# Patient Record
Sex: Female | Born: 1960 | Race: White | Hispanic: No | Marital: Married | Smoking: Never smoker
Health system: Southern US, Community
[De-identification: ages and names within clinical notes are randomized; demographics above are authoritative.]

## PROBLEM LIST (undated history)

## (undated) DIAGNOSIS — E119 Type 2 diabetes mellitus without complications: Secondary | ICD-10-CM

## (undated) DIAGNOSIS — Z87442 Personal history of urinary calculi: Secondary | ICD-10-CM

## (undated) DIAGNOSIS — T7840XA Allergy, unspecified, initial encounter: Secondary | ICD-10-CM

## (undated) DIAGNOSIS — I10 Essential (primary) hypertension: Secondary | ICD-10-CM

## (undated) HISTORY — DX: Type 2 diabetes mellitus without complications: E11.9

## (undated) HISTORY — DX: Essential (primary) hypertension: I10

## (undated) HISTORY — DX: Allergy, unspecified, initial encounter: T78.40XA

## (undated) HISTORY — PX: CHOLECYSTECTOMY: SHX55

## (undated) HISTORY — DX: Personal history of urinary calculi: Z87.442

## (undated) HISTORY — PX: NASAL SINUS SURGERY: SHX719

## (undated) HISTORY — PX: INNER EAR SURGERY: SHX679

## (undated) HISTORY — PX: TONSILLECTOMY AND ADENOIDECTOMY: SUR1326

---

## 1997-12-02 ENCOUNTER — Emergency Department (HOSPITAL_COMMUNITY): Admission: EM | Admit: 1997-12-02 | Discharge: 1997-12-03 | Payer: Self-pay | Admitting: Emergency Medicine

## 1999-11-23 ENCOUNTER — Other Ambulatory Visit: Admission: RE | Admit: 1999-11-23 | Discharge: 1999-11-23 | Payer: Self-pay | Admitting: Gynecology

## 2003-04-06 ENCOUNTER — Other Ambulatory Visit: Admission: RE | Admit: 2003-04-06 | Discharge: 2003-04-06 | Payer: Self-pay | Admitting: Obstetrics and Gynecology

## 2005-05-21 ENCOUNTER — Other Ambulatory Visit: Admission: RE | Admit: 2005-05-21 | Discharge: 2005-05-21 | Payer: Self-pay | Admitting: Obstetrics and Gynecology

## 2007-02-16 ENCOUNTER — Emergency Department (HOSPITAL_COMMUNITY): Admission: EM | Admit: 2007-02-16 | Discharge: 2007-02-17 | Payer: Self-pay | Admitting: Emergency Medicine

## 2009-05-29 DIAGNOSIS — I10 Essential (primary) hypertension: Secondary | ICD-10-CM | POA: Insufficient documentation

## 2009-06-07 DIAGNOSIS — E119 Type 2 diabetes mellitus without complications: Secondary | ICD-10-CM | POA: Insufficient documentation

## 2010-08-19 ENCOUNTER — Inpatient Hospital Stay (HOSPITAL_COMMUNITY): Admission: AD | Admit: 2010-08-19 | Payer: Self-pay | Source: Ambulatory Visit | Admitting: Obstetrics & Gynecology

## 2011-01-03 DIAGNOSIS — E669 Obesity, unspecified: Secondary | ICD-10-CM | POA: Insufficient documentation

## 2011-03-07 LAB — I-STAT 8, (EC8 V) (CONVERTED LAB)
Acid-Base Excess: 1
Bicarbonate: 27.1 — ABNORMAL HIGH
HCT: 48 — ABNORMAL HIGH
Operator id: 272551
pCO2, Ven: 46.6

## 2011-03-07 LAB — POCT PREGNANCY, URINE
Operator id: 272551
Preg Test, Ur: NEGATIVE

## 2011-03-07 LAB — DIFFERENTIAL
Lymphocytes Relative: 13
Lymphs Abs: 1.6
Neutrophils Relative %: 83 — ABNORMAL HIGH

## 2011-03-07 LAB — CBC
HCT: 43
Platelets: 251
WBC: 12.3 — ABNORMAL HIGH

## 2011-03-07 LAB — URINALYSIS, ROUTINE W REFLEX MICROSCOPIC
Glucose, UA: NEGATIVE
Ketones, ur: NEGATIVE
Protein, ur: NEGATIVE

## 2011-03-07 LAB — URINE MICROSCOPIC-ADD ON

## 2011-03-07 LAB — POCT I-STAT CREATININE: Creatinine, Ser: 0.9

## 2014-09-16 ENCOUNTER — Encounter: Payer: Self-pay | Admitting: Internal Medicine

## 2015-08-25 ENCOUNTER — Ambulatory Visit (AMBULATORY_SURGERY_CENTER): Payer: Self-pay | Admitting: *Deleted

## 2015-08-25 VITALS — Ht 63.5 in | Wt 197.0 lb

## 2015-08-25 DIAGNOSIS — Z1211 Encounter for screening for malignant neoplasm of colon: Secondary | ICD-10-CM

## 2015-08-25 MED ORDER — NA SULFATE-K SULFATE-MG SULF 17.5-3.13-1.6 GM/177ML PO SOLN: 1.0000 | Freq: Once | ORAL | Status: DC

## 2015-08-25 NOTE — Progress Notes (Signed)
No egg or soy allergy known to patient  with past sedation issues with any surgeries  or procedures, no intubation problems --anesthesia makes her have increased N/V --- No diet pills per patient No home 02 use per patient  No blood thinners per patient  Pt denies issues with constipation  emmi video declined

## 2015-09-05 ENCOUNTER — Ambulatory Visit (AMBULATORY_SURGERY_CENTER): Payer: BC Managed Care – PPO | Admitting: Gastroenterology

## 2015-09-05 ENCOUNTER — Encounter: Payer: Self-pay | Admitting: Gastroenterology

## 2015-09-05 VITALS — BP 132/63 | HR 71 | Temp 98.4°F | Resp 17 | Ht 63.0 in | Wt 197.0 lb

## 2015-09-05 DIAGNOSIS — D122 Benign neoplasm of ascending colon: Secondary | ICD-10-CM

## 2015-09-05 DIAGNOSIS — K635 Polyp of colon: Secondary | ICD-10-CM

## 2015-09-05 DIAGNOSIS — D123 Benign neoplasm of transverse colon: Secondary | ICD-10-CM | POA: Diagnosis not present

## 2015-09-05 DIAGNOSIS — D125 Benign neoplasm of sigmoid colon: Secondary | ICD-10-CM

## 2015-09-05 DIAGNOSIS — Z1211 Encounter for screening for malignant neoplasm of colon: Secondary | ICD-10-CM

## 2015-09-05 LAB — GLUCOSE, CAPILLARY
Glucose-Capillary: 154 mg/dL — ABNORMAL HIGH (ref 65–99)
Glucose-Capillary: 158 mg/dL — ABNORMAL HIGH (ref 65–99)

## 2015-09-05 MED ORDER — SODIUM CHLORIDE 0.9 % IV SOLN: 500.0000 mL | INTRAVENOUS | Status: DC

## 2015-09-05 NOTE — Op Note (Signed)
[facility] Patient Name: Marie Mcintyre Procedure Date: 09/05/2015 9:13 AM MRN: [id number] Endoscopist: Mauri Pole , MD Age: 55 Date of Birth: February 13, 1961 Gender: Female Procedure:                Colonoscopy Indications:              Screening for colorectal malignant neoplasm Medicines:                Monitored Anesthesia Care Procedure:                Pre-Anesthesia Assessment:                           - Prior to the procedure, a History and Physical                            was performed, and patient medications and                            allergies were reviewed. The patient's tolerance of                            previous anesthesia was also reviewed. The risks                            and benefits of the procedure and the sedation                            options and risks were discussed with the patient.                            All questions were answered, and informed consent                            was obtained. Prior Anticoagulants: The patient has                            taken no previous anticoagulant or antiplatelet                            agents. ASA Grade Assessment: II - A patient with                            mild systemic disease. After reviewing the risks                            and benefits, the patient was deemed in                            satisfactory condition to undergo the procedure.                           After obtaining informed consent, the colonoscope  was passed under direct vision. Throughout the                            procedure, the patient's blood pressure, pulse, and                            oxygen saturations were monitored continuously. The                            Model [id number]) scope was introduced                            through the anus and advanced to the the terminal                            ileum, with identification of the appendiceal                          orifice and IC valve. The colonoscopy was performed                            without difficulty. The patient tolerated the                            procedure well. The quality of the bowel                            preparation was good. The terminal ileum, ileocecal                            valve, appendiceal orifice, and rectum were                            photographed. Scope In: 9:28:50 AM Scope Out: 9:46:16 AM Scope Withdrawal Time: 0 hours 13 minutes 1 second  Total Procedure Duration: 0 hours 17 minutes 26 seconds  Findings:                 The perianal and digital rectal examinations were                            normal.                           A 12 mm polyp was found in the ascending colon. The                            polyp was sessile. The polyp was removed with a hot                            snare. Resection and retrieval were complete.                           A 8 mm polyp was found in the transverse colon. The  polyp was sessile. The polyp was removed with a                            cold snare. Resection and retrieval were complete.                           A 4 mm polyp was found in the sigmoid colon. The                            polyp was sessile. The polyp was removed with a                            cold biopsy forceps. Resection and retrieval were                            complete.                           A few small-mouthed diverticula were found in the                            sigmoid colon, descending colon and ascending colon. Complications:            No immediate complications. Estimated Blood Loss:     Estimated blood loss was minimal. Impression:               - One 12 mm polyp in the ascending colon, removed                            with a hot snare. Resected and retrieved.                           - One 8 mm polyp in the transverse colon, removed                            with a  cold snare. Resected and retrieved.                           - One 4 mm polyp in the sigmoid colon, removed with                            a cold biopsy forceps. Resected and retrieved.                           - Diverticulosis in the sigmoid colon, in the                            descending colon and in the ascending colon. Recommendation:           - Patient has a contact number available for                            emergencies. The signs and symptoms of potential  delayed complications were discussed with the                            patient. Return to normal activities tomorrow.                            Written discharge instructions were provided to the                            patient.                           - Resume previous diet.                           - Continue present medications.                           - Await pathology results.                           - Repeat colonoscopy in 3 years for surveillance.                           - Return to GI clinic PRN. Mauri Pole, MD 09/05/2015 9:55:19 AM This report has been signed electronically.

## 2015-09-05 NOTE — Progress Notes (Signed)
Patient awakening,vss,report to rn 

## 2015-09-05 NOTE — Progress Notes (Signed)
Called to room to assist during endoscopic procedure.  Patient ID and intended procedure confirmed with present staff. Received instructions for my participation in the procedure from the performing physician.  

## 2015-09-05 NOTE — Patient Instructions (Signed)
YOU HAD AN ENDOSCOPIC PROCEDURE TODAY AT THE [facility]:   Refer to the procedure report that was given to you for any specific questions about what was found during the examination.  If the procedure report does not answer your questions, please call your gastroenterologist to clarify.  If you requested that your care partner not be given the details of your procedure findings, then the procedure report has been included in a sealed envelope for you to review at your convenience later.  YOU SHOULD EXPECT: Some feelings of bloating in the abdomen. Passage of more gas than usual.  Walking can help get rid of the air that was put into your GI tract during the procedure and reduce the bloating. If you had a lower endoscopy (such as a colonoscopy or flexible sigmoidoscopy) you may notice spotting of blood in your stool or on the toilet paper. If you underwent a bowel prep for your procedure, you may not have a normal bowel movement for a few days.  Please Note:  You might notice some irritation and congestion in your nose or some drainage.  This is from the oxygen used during your procedure.  There is no need for concern and it should clear up in a day or so.  SYMPTOMS TO REPORT IMMEDIATELY:   Following lower endoscopy (colonoscopy or flexible sigmoidoscopy):  Excessive amounts of blood in the stool  Significant tenderness or worsening of abdominal pains  Swelling of the abdomen that is new, acute  Fever of 100F or higher    For urgent or emergent issues, a gastroenterologist can be reached at any hour by calling [phone].   DIET: Your first meal following the procedure should be a small meal and then it is ok to progress to your normal diet. Heavy or fried foods are harder to digest and may make you feel nauseous or bloated.  Likewise, meals heavy in dairy and vegetables can increase bloating.  Drink plenty of fluids but you should avoid alcoholic beverages for 24  hours.  ACTIVITY:  You should plan to take it easy for the rest of today and you should NOT DRIVE or use heavy machinery until tomorrow (because of the sedation medicines used during the test).    FOLLOW UP: Our staff will call the number listed on your records the next business day following your procedure to check on you and address any questions or concerns that you may have regarding the information given to you following your procedure. If we do not reach you, we will leave a message.  However, if you are feeling well and you are not experiencing any problems, there is no need to return our call.  We will assume that you have returned to your regular daily activities without incident.  If any biopsies were taken you will be contacted by phone or by letter within the next 1-3 weeks.  Please call us at [phone] if you have not heard about the biopsies in 3 weeks.    SIGNATURES/CONFIDENTIALITY: You and/or your care partner have signed paperwork which will be entered into your electronic medical record.  These signatures attest to the fact that that the information above on your After Visit Summary has been reviewed and is understood.  Full responsibility of the confidentiality of this discharge information lies with you and/or your care-partner.   Resume medications. Information given on polyps,diverticulosis and high fiber diet. 

## 2015-09-06 ENCOUNTER — Telehealth: Payer: Self-pay

## 2015-09-06 NOTE — Telephone Encounter (Signed)
  Follow up Call-  Call back number 09/05/2015  Post procedure Call Back phone  # [phone]  Permission to leave phone message Yes     Patient questions:  Do you have a fever, pain , or abdominal swelling? No. Pain Score  0 *  Have you tolerated food without any problems? Yes.    Have you been able to return to your normal activities? Yes.    Do you have any questions about your discharge instructions: Diet   No. Medications  No. Follow up visit  No.  Do you have questions or concerns about your Care? No.  Actions: * If pain score is 4 or above: No action needed, pain <4.

## 2015-09-13 ENCOUNTER — Encounter: Payer: Self-pay | Admitting: Gastroenterology

## 2018-09-30 DIAGNOSIS — B3731 Acute candidiasis of vulva and vagina: Secondary | ICD-10-CM | POA: Insufficient documentation

## 2018-10-02 ENCOUNTER — Encounter: Payer: Self-pay | Admitting: Gastroenterology

## 2019-04-19 DIAGNOSIS — Z0000 Encounter for general adult medical examination without abnormal findings: Secondary | ICD-10-CM | POA: Insufficient documentation

## 2021-04-27 ENCOUNTER — Other Ambulatory Visit: Payer: Self-pay | Admitting: Obstetrics

## 2021-04-27 DIAGNOSIS — Z1231 Encounter for screening mammogram for malignant neoplasm of breast: Secondary | ICD-10-CM

## 2021-05-30 ENCOUNTER — Ambulatory Visit
Admission: RE | Admit: 2021-05-30 | Discharge: 2021-05-30 | Disposition: A | Discharge status: Home / Self Care | Payer: BC Managed Care – PPO | Source: Ambulatory Visit | Attending: Obstetrics | Admitting: Obstetrics

## 2021-05-30 DIAGNOSIS — Z1231 Encounter for screening mammogram for malignant neoplasm of breast: Secondary | ICD-10-CM

## 2022-01-30 DIAGNOSIS — J45909 Unspecified asthma, uncomplicated: Secondary | ICD-10-CM | POA: Insufficient documentation

## 2022-09-12 IMAGING — MG MM DIGITAL SCREENING BILAT W/ TOMO AND CAD
6 of 10 series · 6 of 30 positions shown · non-contrast
Comparison: Previous exam(s).

CLINICAL DATA: Screening.

EXAM:
DIGITAL SCREENING BILATERAL MAMMOGRAM WITH TOMOSYNTHESIS AND CAD
TECHNIQUE: Bilateral screening digital craniocaudal and mediolateral oblique
mammograms were obtained. Bilateral screening digital breast
tomosynthesis was performed. The images were evaluated with
computer-aided detection.

[L MLO synth-2D]
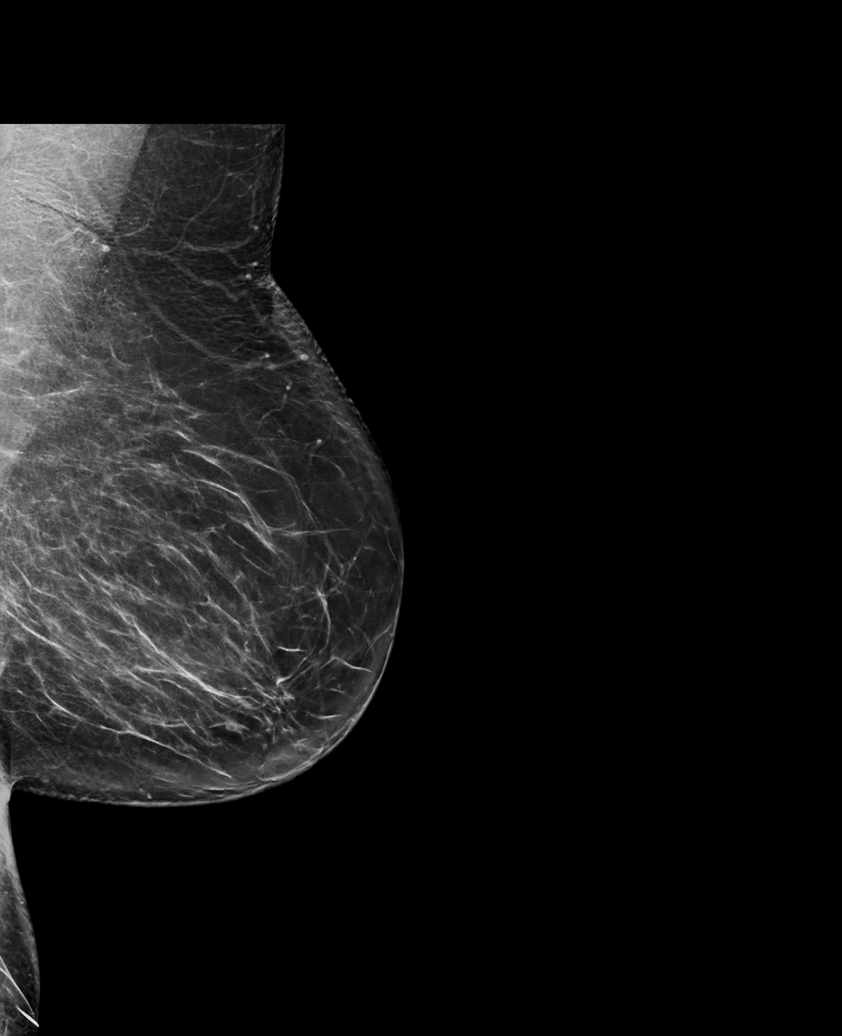

[R MLO synth-2D (1 of 2)]
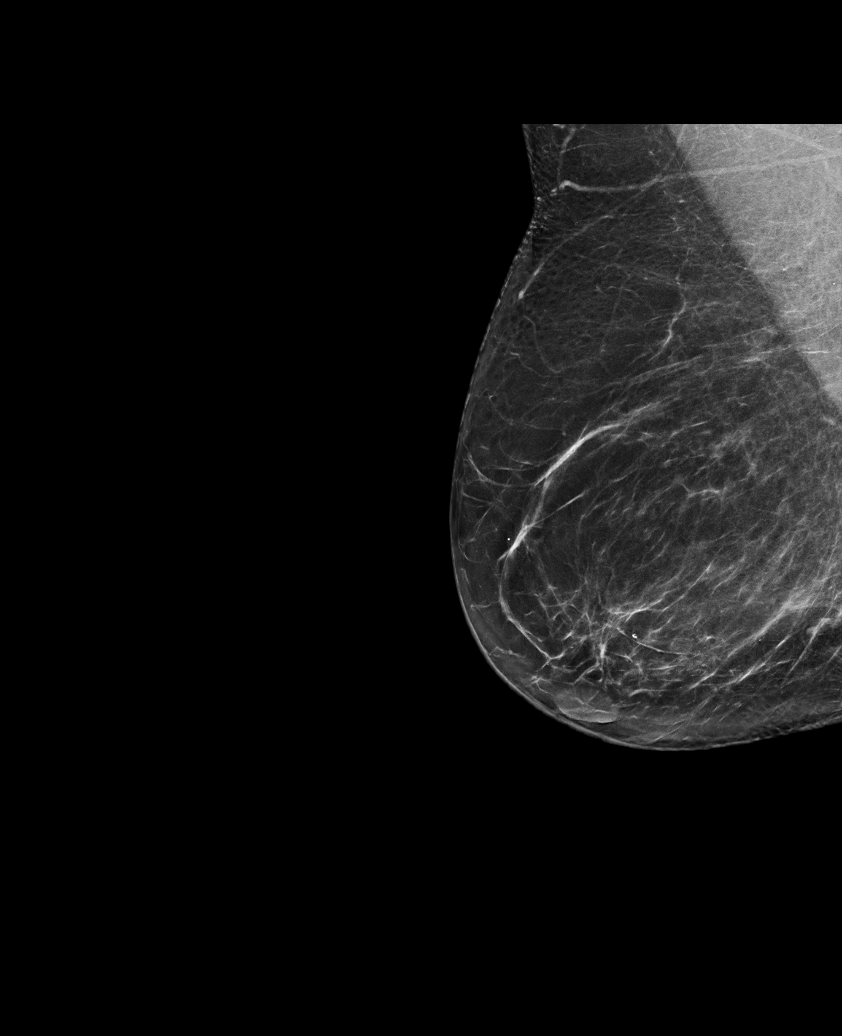

[R CC synth-2D]
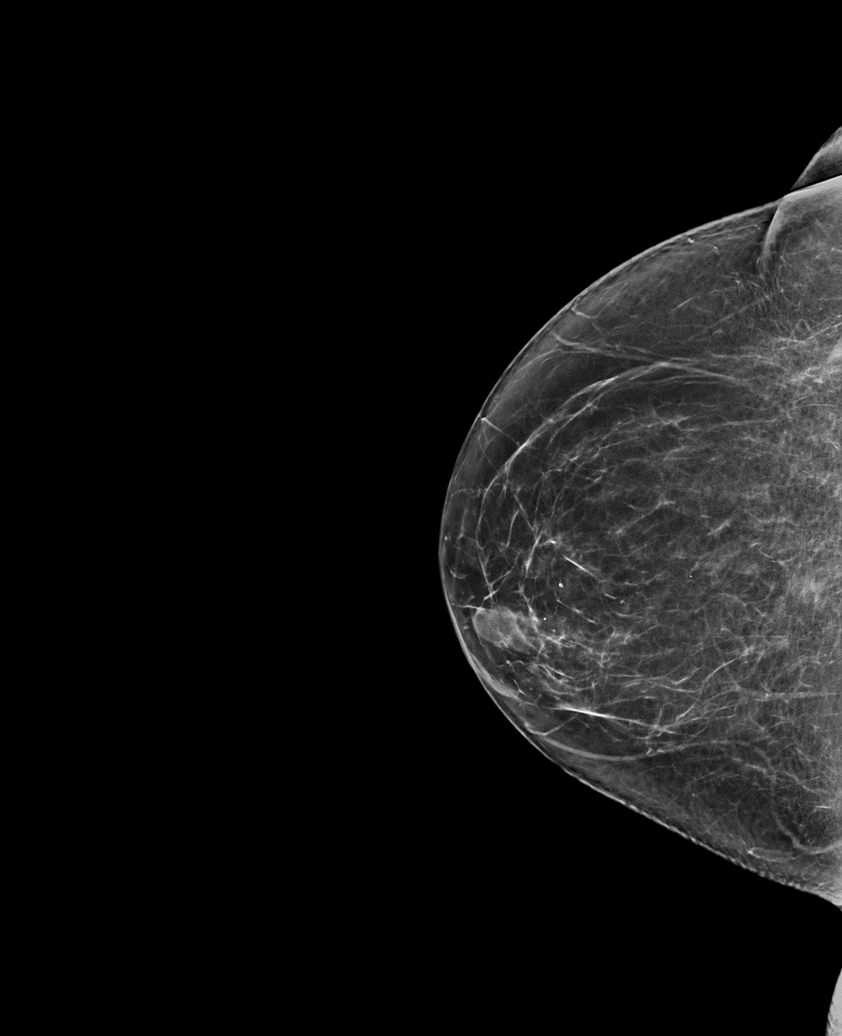

[R MLO synth-2D (2 of 2)]
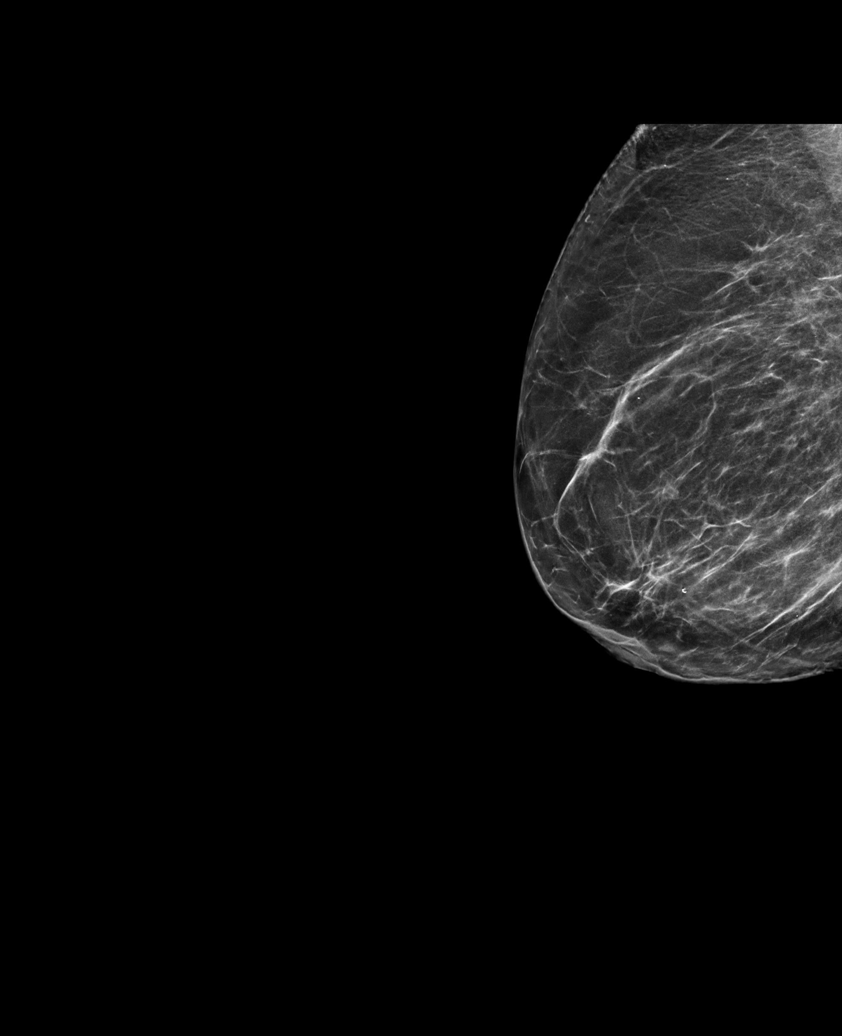

[L CC synth-2D]
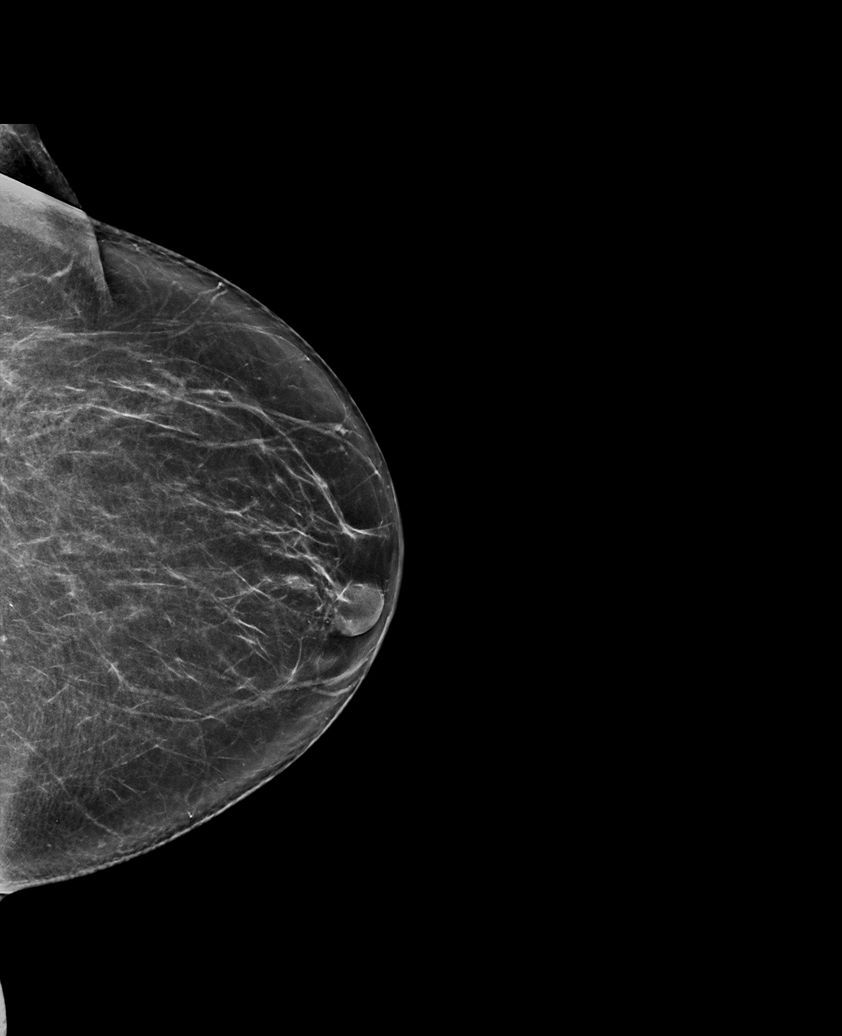

[R CC tomo · tomo slice 35/70.0]
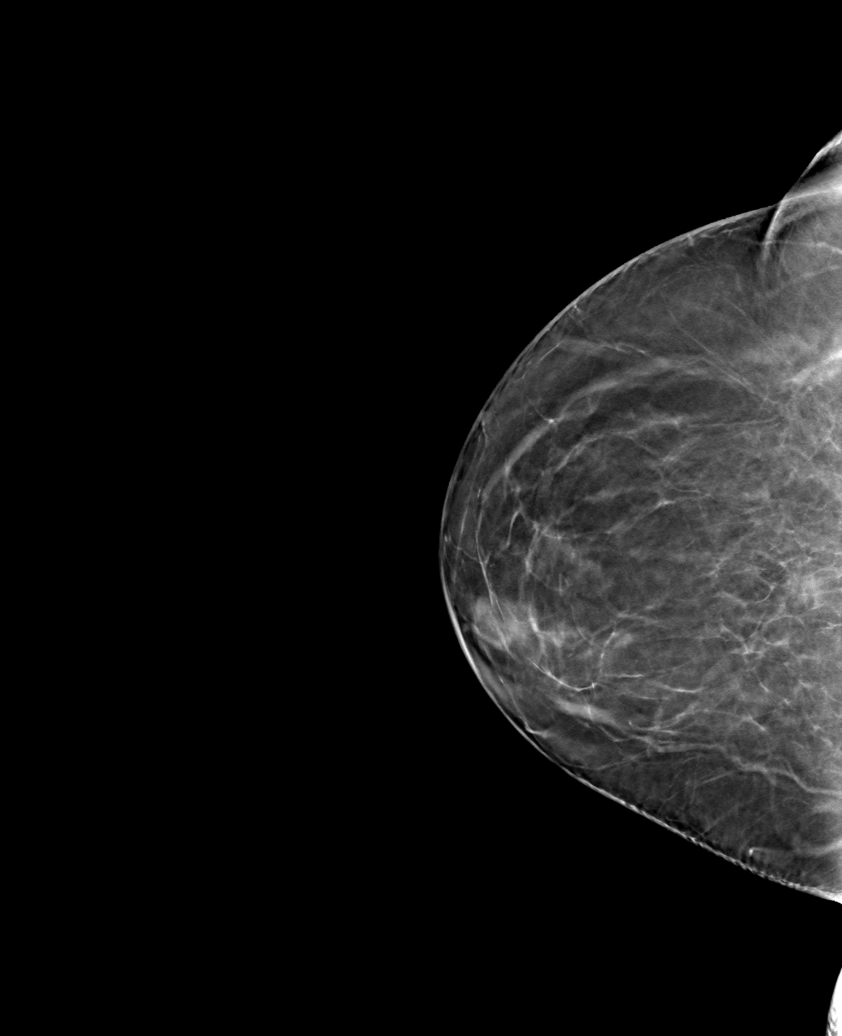

[6 of 30 positions shown; findings below may reference images not displayed]

ACR Breast Density Category b: There are scattered areas of
fibroglandular density.
FINDINGS: There are no findings suspicious for malignancy.
IMPRESSION: No mammographic evidence of malignancy. A result letter of this
screening mammogram will be mailed directly to the patient.

RECOMMENDATION:
Screening mammogram in one year. (Code:51-O-LD2)

BI-RADS CATEGORY  1: Negative.

## 2023-02-04 ENCOUNTER — Encounter: Payer: Self-pay | Admitting: Gastroenterology

## 2023-04-10 ENCOUNTER — Telehealth: Payer: Self-pay

## 2023-04-10 ENCOUNTER — Ambulatory Visit (AMBULATORY_SURGERY_CENTER): Payer: BC Managed Care – PPO

## 2023-04-10 VITALS — Ht 63.0 in | Wt 170.0 lb

## 2023-04-10 DIAGNOSIS — Z860100 Personal history of colon polyps, unspecified: Secondary | ICD-10-CM

## 2023-04-10 NOTE — Telephone Encounter (Signed)
No show.  Left message that I would call back in 10 min

## 2023-04-11 MED ORDER — NA SULFATE-K SULFATE-MG SULF 17.5-3.13-1.6 GM/177ML PO SOLN: 1.0000 | Freq: Once | ORAL | 0 refills | Status: AC

## 2023-04-11 NOTE — Progress Notes (Signed)

## 2023-04-14 ENCOUNTER — Telehealth: Payer: Self-pay | Admitting: Gastroenterology

## 2023-04-14 NOTE — Telephone Encounter (Signed)
PT is calling to speak to manager pertaining to a "concern" she has. Please advise.

## 2023-04-14 NOTE — Telephone Encounter (Signed)
I returned a call to the patient.  No answer

## 2023-04-15 NOTE — Telephone Encounter (Signed)
I spoke with the patient about her scheduling frustrations.  I apologized for her experience.  She has her and her husbands procedures scheduled and thanked me for listening to her.

## 2023-04-16 ENCOUNTER — Encounter: Payer: Self-pay | Admitting: Gastroenterology

## 2023-05-01 ENCOUNTER — Encounter: Payer: BC Managed Care – PPO | Admitting: Gastroenterology

## 2023-05-10 ENCOUNTER — Encounter: Payer: Self-pay | Admitting: Certified Registered Nurse Anesthetist

## 2023-05-12 ENCOUNTER — Ambulatory Visit: Payer: BC Managed Care – PPO | Admitting: Gastroenterology

## 2023-05-12 ENCOUNTER — Encounter: Payer: Self-pay | Admitting: Gastroenterology

## 2023-05-12 VITALS — BP 100/60 | HR 64 | Temp 98.1°F | Resp 13 | Ht 63.0 in | Wt 170.0 lb

## 2023-05-12 DIAGNOSIS — D122 Benign neoplasm of ascending colon: Secondary | ICD-10-CM

## 2023-05-12 DIAGNOSIS — K621 Rectal polyp: Secondary | ICD-10-CM

## 2023-05-12 DIAGNOSIS — D124 Benign neoplasm of descending colon: Secondary | ICD-10-CM

## 2023-05-12 DIAGNOSIS — K644 Residual hemorrhoidal skin tags: Secondary | ICD-10-CM | POA: Diagnosis not present

## 2023-05-12 DIAGNOSIS — K635 Polyp of colon: Secondary | ICD-10-CM | POA: Diagnosis not present

## 2023-05-12 DIAGNOSIS — D123 Benign neoplasm of transverse colon: Secondary | ICD-10-CM

## 2023-05-12 DIAGNOSIS — Z1211 Encounter for screening for malignant neoplasm of colon: Secondary | ICD-10-CM

## 2023-05-12 DIAGNOSIS — D128 Benign neoplasm of rectum: Secondary | ICD-10-CM

## 2023-05-12 DIAGNOSIS — Z860100 Personal history of colon polyps, unspecified: Secondary | ICD-10-CM

## 2023-05-12 DIAGNOSIS — Z860101 Personal history of adenomatous and serrated colon polyps: Secondary | ICD-10-CM | POA: Diagnosis not present

## 2023-05-12 DIAGNOSIS — K648 Other hemorrhoids: Secondary | ICD-10-CM

## 2023-05-12 MED ORDER — SODIUM CHLORIDE 0.9 % IV SOLN: 500.0000 mL | Freq: Once | INTRAVENOUS | Status: DC

## 2023-05-12 NOTE — Patient Instructions (Addendum)
Resume previous diet Continue present medications Await pathology results Handouts/information given for polyps and hemorrhoids  YOU HAD AN ENDOSCOPIC PROCEDURE TODAY AT THE [facility]:   Refer to the procedure report that was given to you for any specific questions about what was found during the examination.  If the procedure report does not answer your questions, please call your gastroenterologist to clarify.  If you requested that your care partner not be given the details of your procedure findings, then the procedure report has been included in a sealed envelope for you to review at your convenience later.  YOU SHOULD EXPECT: Some feelings of bloating in the abdomen. Passage of more gas than usual.  Walking can help get rid of the air that was put into your GI tract during the procedure and reduce the bloating. If you had a lower endoscopy (such as a colonoscopy or flexible sigmoidoscopy) you may notice spotting of blood in your stool or on the toilet paper. If you underwent a bowel prep for your procedure, you may not have a normal bowel movement for a few days.  Please Note:  You might notice some irritation and congestion in your nose or some drainage.  This is from the oxygen used during your procedure.  There is no need for concern and it should clear up in a day or so.  SYMPTOMS TO REPORT IMMEDIATELY:  Following lower endoscopy (colonoscopy):  Excessive amounts of blood in the stool  Significant tenderness or worsening of abdominal pains  Swelling of the abdomen that is new, acute  Fever of 100F or higher  For urgent or emergent issues, a gastroenterologist can be reached at any hour by calling [phone]. Do not use MyChart messaging for urgent concerns.   DIET:  We do recommend a small meal at first, but then you may proceed to your regular diet.  Drink plenty of fluids but you should avoid alcoholic beverages for 24 hours.  ACTIVITY:  You should plan to take  it easy for the rest of today and you should NOT DRIVE or use heavy machinery until tomorrow (because of the sedation medicines used during the test).    FOLLOW UP: Our staff will call the number listed on your records the next business day following your procedure.  We will call around 7:15- 8:00 am to check on you and address any questions or concerns that you may have regarding the information given to you following your procedure. If we do not reach you, we will leave a message.     If any biopsies were taken you will be contacted by phone or by letter within the next 1-3 weeks.  Please call us at [phone] if you have not heard about the biopsies in 3 weeks.    SIGNATURES/CONFIDENTIALITY: You and/or your care partner have signed paperwork which will be entered into your electronic medical record.  These signatures attest to the fact that that the information above on your After Visit Summary has been reviewed and is understood.  Full responsibility of the confidentiality of this discharge information lies with you and/or your care-partner. 

## 2023-05-12 NOTE — Progress Notes (Unsigned)
[facility] History and Physical   Primary Care Physician:  Geoffry Paradise, MD   Reason for Procedure:  History of adenomatous colon polyps  Plan:    Surveillance colonoscopy with possible interventions as needed     HPI: Marie Mcintyre is a very pleasant 62 y.o. female here for surveillance colonoscopy. Denies any nausea, vomiting, abdominal pain, melena or bright red blood per rectum  The risks and benefits as well as alternatives of endoscopic procedure(s) have been discussed and reviewed. All questions answered. The patient agrees to proceed.    Past Medical History:  Diagnosis Date   Allergy    seasonal   Diabetes mellitus without complication (HCC)    History of kidney stones    Hypertension     Past Surgical History:  Procedure Laterality Date   CHOLECYSTECTOMY     INNER EAR SURGERY     numerous-pe tubes 7-8 x   NASAL SINUS SURGERY     TONSILLECTOMY AND ADENOIDECTOMY      Prior to Admission medications   Medication Sig Start Date End Date Taking? Authorizing Provider  albuterol (VENTOLIN HFA) 108 (90 Base) MCG/ACT inhaler INHALE 2 PUFFS INHALATION EVERY 6 HOURS AS NEEDED FOR WHEEZING 30 DAYS    [provider]  canagliflozin (INVOKANA) 100 MG TABS tablet Take 100 mg by mouth daily before breakfast. Patient not taking: Reported on 04/11/2023    [provider]  fluticasone (FLONASE) 50 MCG/ACT nasal spray     [provider]  JARDIANCE 25 MG TABS tablet TAKE 1 TABLET BY MOUTH EVERY DAY FOR 90 DAYS    [provider]  Loratadine (CLARITIN PO)     [provider]  losartan-hydrochlorothiazide (HYZAAR) 100-25 MG tablet Take 1 tablet by mouth daily.    [provider]  metFORMIN (GLUCOPHAGE) 500 MG tablet Take 500 mg by mouth 2 (two) times daily with a meal.    [provider]  metoprolol succinate (TOPROL-XL) 25 MG 24 hr tablet Take 25 mg by mouth daily.    [provider]   OZEMPIC, 0.25 OR 0.5 MG/DOSE, 2 MG/3ML SOPN INJECT 0.25MG  WEEKLY SUBCUTANEOUS 30 DAYS    [provider]    Current Outpatient Medications  Medication Sig Dispense Refill   albuterol (VENTOLIN HFA) 108 (90 Base) MCG/ACT inhaler INHALE 2 PUFFS INHALATION EVERY 6 HOURS AS NEEDED FOR WHEEZING 30 DAYS     canagliflozin (INVOKANA) 100 MG TABS tablet Take 100 mg by mouth daily before breakfast. (Patient not taking: Reported on 04/11/2023)     fluticasone (FLONASE) 50 MCG/ACT nasal spray      JARDIANCE 25 MG TABS tablet TAKE 1 TABLET BY MOUTH EVERY DAY FOR 90 DAYS     Loratadine (CLARITIN PO)      losartan-hydrochlorothiazide (HYZAAR) 100-25 MG tablet Take 1 tablet by mouth daily.     metFORMIN (GLUCOPHAGE) 500 MG tablet Take 500 mg by mouth 2 (two) times daily with a meal.     metoprolol succinate (TOPROL-XL) 25 MG 24 hr tablet Take 25 mg by mouth daily.     OZEMPIC, 0.25 OR 0.5 MG/DOSE, 2 MG/3ML SOPN INJECT 0.25MG  WEEKLY SUBCUTANEOUS 30 DAYS     No current facility-administered medications for this visit.    Allergies as of 05/12/2023 - Review Complete 05/10/2023  Allergen Reaction Noted   Amoxicillin Other (See Comments) 08/25/2015   Codeine Nausea And Vomiting 08/25/2015    Family History  Problem Relation Age of Onset   Colon cancer Neg  Hx    Colon polyps Neg Hx    Esophageal cancer Neg Hx    Rectal cancer Neg Hx    Stomach cancer Neg Hx     Social History   Socioeconomic History   Marital status: Married    Spouse name: Not on file   Number of children: Not on file   Years of education: Not on file   Highest education level: Not on file  Occupational History   Not on file  Tobacco Use   Smoking status: Never   Smokeless tobacco: Never  Substance and Sexual Activity   Alcohol use: Yes    Alcohol/week: 0.0 standard drinks of alcohol    Comment: occasional    Drug use: No   Sexual activity: Not on file  Other Topics Concern   Not on file  Social History  Narrative   Not on file   Social Drivers of Health   Financial Resource Strain: Not on file  Food Insecurity: Not on file  Transportation Needs: Not on file  Physical Activity: Not on file  Stress: Not on file  Social Connections: Not on file  Intimate Partner Violence: Not on file    Review of Systems:  All other review of systems negative except as mentioned in the HPI.  Physical Exam: Vital signs in last 24 hours: There were no vitals taken for this visit. General:   Alert, NAD Lungs:  Clear .   Heart:  Regular rate and rhythm Abdomen:  Soft, nontender and nondistended. Neuro/Psych:  Alert and cooperative. Normal mood and affect. A and O x 3  Reviewed labs, radiology imaging, old records and pertinent past GI work up  Patient is appropriate for planned procedure(s) and anesthesia in an ambulatory setting   K. Scherry Ran , MD [phone]

## 2023-05-12 NOTE — Progress Notes (Unsigned)
Report given to PACU, vss 

## 2023-05-12 NOTE — Progress Notes (Unsigned)
Pt's states no medical or surgical changes since previsit or office visit. 

## 2023-05-12 NOTE — Progress Notes (Signed)
Called to room to assist during endoscopic procedure.  Patient ID and intended procedure confirmed with present staff. Received instructions for my participation in the procedure from the performing physician.  

## 2023-05-12 NOTE — Op Note (Signed)
[facility] Patient Name: Marie Mcintyre Procedure Date: 05/12/2023 4:29 PM MRN: [id number] Endoscopist: Napoleon Form , MD, [id number] Age: 62 Referring MD:  Date of Birth: 1960/07/06 Gender: Female Account #: [id number] Procedure:                Colonoscopy Indications:              High risk colon cancer surveillance: Personal                            history of adenoma (10 mm or greater in size), High                            risk colon cancer surveillance: Personal history of                            multiple (3 or more) adenomas Procedure:                Pre-Anesthesia Assessment:                           - Prior to the procedure, a History and Physical                            was performed, and patient medications and                            allergies were reviewed. The patient's tolerance of                            previous anesthesia was also reviewed. The risks                            and benefits of the procedure and the sedation                            options and risks were discussed with the patient.                            All questions were answered, and informed consent                            was obtained. Prior Anticoagulants: The patient has                            taken no anticoagulant or antiplatelet agents. ASA                            Grade Assessment: II - A patient with mild systemic                            disease. After reviewing the risks and benefits,                            the patient  was deemed in satisfactory condition to                            undergo the procedure.                           After obtaining informed consent, the colonoscope                            was passed under direct vision. Throughout the                            procedure, the patient's blood pressure, pulse, and                            oxygen saturations were monitored continuously. The                             Olympus Scope SN [id number] was introduced through the                            anus and advanced to the the cecum, identified by                            appendiceal orifice and ileocecal valve. The                            colonoscopy was performed without difficulty. The                            patient tolerated the procedure well. The quality                            of the bowel preparation was good. The ileocecal                            valve, appendiceal orifice, and rectum were                            photographed. Scope In: 4:34:02 PM Scope Out: 4:58:13 PM Scope Withdrawal Time: 0 hours 20 minutes 6 seconds  Total Procedure Duration: 0 hours 24 minutes 11 seconds  Findings:                 The perianal and digital rectal examinations were                            normal.                           Six sessile polyps were found in the rectum,                            descending colon, transverse colon and ascending  colon. The polyps were 4 to 7 mm in size. These                            polyps were removed with a cold snare. Resection                            and retrieval were complete.                           Non-bleeding external and internal hemorrhoids were                            found during retroflexion. The hemorrhoids were                            medium-sized. Complications:            No immediate complications. Estimated Blood Loss:     Estimated blood loss was minimal. Impression:               - Six 4 to 7 mm polyps in the rectum, in the                            descending colon, in the transverse colon and in                            the ascending colon, removed with a cold snare.                            Resected and retrieved.                           - Non-bleeding external and internal hemorrhoids. Recommendation:           - Patient has a contact number available for                             emergencies. The signs and symptoms of potential                            delayed complications were discussed with the                            patient. Return to normal activities tomorrow.                            Written discharge instructions were provided to the                            patient.                           - Resume previous diet.                           - Continue present medications.                           -  Await pathology results.                           - Repeat colonoscopy in 3 - 5 years for                            surveillance based on pathology results. Napoleon Form, MD 05/12/2023 5:17:01 PM This report has been signed electronically.

## 2023-05-13 ENCOUNTER — Telehealth: Payer: Self-pay | Admitting: *Deleted

## 2023-05-13 NOTE — Telephone Encounter (Signed)
Attempted f/u phone call. No answer. Left message. °

## 2023-05-16 LAB — SURGICAL PATHOLOGY

## 2023-06-04 ENCOUNTER — Encounter (INDEPENDENT_AMBULATORY_CARE_PROVIDER_SITE_OTHER): Payer: Self-pay

## 2023-06-04 ENCOUNTER — Ambulatory Visit (INDEPENDENT_AMBULATORY_CARE_PROVIDER_SITE_OTHER): Payer: 59 | Admitting: Otolaryngology

## 2023-06-04 VITALS — BP 141/81 | HR 74 | Ht 63.0 in | Wt 171.0 lb

## 2023-06-04 DIAGNOSIS — H608X3 Other otitis externa, bilateral: Secondary | ICD-10-CM | POA: Diagnosis not present

## 2023-06-05 DIAGNOSIS — H608X3 Other otitis externa, bilateral: Secondary | ICD-10-CM | POA: Insufficient documentation

## 2023-06-05 NOTE — Progress Notes (Signed)
 Patient ID: Marie Mcintyre, female   DOB: Nov 01, 1960, 63 y.o.   MRN: [id number]  Follow-up: Bilateral chronic eczematous otitis externa.  HPI: The patient is a 63 year old female who returns today for her follow-up evaluation.  The patient was last seen 1 year ago.  At that time, she was noted to have bilateral chronic eczematous otitis externa.  She was treated with Elocon cream. The patient has a history of recurrent ear infections.  She previously underwent bilateral myringotomy and tube placement 7 times.  The tubes have since extruded.  The patient returns today reporting significant improvement in her symptoms.  The itchy sensation is currently under control with the use of Elocon cream as needed.  Currently she denies any otalgia, otorrhea, or hearing difficulty.  Exam: General: Communicates without difficulty, well nourished, no acute distress. Head: Normocephalic, no evidence injury, no tenderness, facial buttresses intact without stepoff. Face/sinus: No tenderness to palpation and percussion. Facial movement is normal and symmetric. Eyes: PERRL, EOMI. No scleral icterus, conjunctivae clear. Neuro: CN II exam reveals vision grossly intact.  No nystagmus at any point of gaze. Ears: Auricles well formed without lesions. Eczematous changes are noted in her ear canals bilaterally, worse on the left side.  The TMs are intact without fluid. Bilateral tympanic membrane scarring is noted. Nose: External evaluation reveals normal support and skin without lesions.  Dorsum is intact.  Anterior rhinoscopy reveals pink mucosa over anterior aspect of inferior turbinates and intact septum.  No purulence noted. Oral:  Oral cavity and oropharynx are intact, symmetric, without erythema or edema.  Mucosa is moist without lesions. Neck: Full range of motion without pain.  There is no significant lymphadenopathy.  No masses palpable.  Thyroid bed within normal limits to palpation.  Parotid glands and submandibular glands  equal bilaterally without mass.  Trachea is midline. Neuro:  CN 2-12 grossly intact. Gait normal.   Assessment: 1.  Bilateral chronic eczematous otitis externa. 2.  Her symptoms are currently under control with the use of Elocon cream as needed.  Plan: 1.  The physical exam findings are reviewed with the patient. 2.  Continue the use of Elocon cream as needed. 3.  The patient is reassured that no infection is noted today. 4.  She is encouraged to call with any questions or concerns.

## 2023-06-17 ENCOUNTER — Encounter: Payer: Self-pay | Admitting: Gastroenterology

## 2023-11-03 ENCOUNTER — Ambulatory Visit (INDEPENDENT_AMBULATORY_CARE_PROVIDER_SITE_OTHER)

## 2023-11-03 ENCOUNTER — Ambulatory Visit: Admitting: Podiatry

## 2023-11-03 DIAGNOSIS — K5909 Other constipation: Secondary | ICD-10-CM | POA: Insufficient documentation

## 2023-11-03 DIAGNOSIS — M7752 Other enthesopathy of left foot: Secondary | ICD-10-CM

## 2023-11-03 DIAGNOSIS — M79672 Pain in left foot: Secondary | ICD-10-CM | POA: Diagnosis not present

## 2023-11-03 DIAGNOSIS — M722 Plantar fascial fibromatosis: Secondary | ICD-10-CM | POA: Insufficient documentation

## 2023-11-03 DIAGNOSIS — R102 Pelvic and perineal pain: Secondary | ICD-10-CM | POA: Insufficient documentation

## 2023-11-03 DIAGNOSIS — E781 Pure hyperglyceridemia: Secondary | ICD-10-CM | POA: Insufficient documentation

## 2023-11-03 DIAGNOSIS — N926 Irregular menstruation, unspecified: Secondary | ICD-10-CM | POA: Insufficient documentation

## 2023-11-03 MED ORDER — TRIAMCINOLONE ACETONIDE 10 MG/ML IJ SUSP
10.0000 mg | Freq: Once | INTRAMUSCULAR | Status: AC
  Administered 2023-11-03: 10 mg

## 2023-11-03 NOTE — Patient Instructions (Signed)

## 2023-11-03 NOTE — Progress Notes (Unsigned)
 Chief Complaint  Patient presents with   Foot Pain    Left heel pain. Pain is primarily in the left heel and radiates into heel. Ongoing since late April. Has tried ibuprofen, ice, massage, essential oils, stretches, ROM exercise, elevation. Was given meloxicam by pcp, which helped some. Last A1c: 6.6. no anticoag    HPI: 63 y.o. female presenting today with c/o pain in the bottom of the left heel.  Pain is rated as 6/10.  Her pain was 10/10 until her PCP had placed her on meloxicam.  She is currently wearing good sneakers.  Past Medical History:  Diagnosis Date   Allergy    seasonal   Diabetes mellitus without complication (HCC)    History of kidney stones    Hypertension    Past Surgical History:  Procedure Laterality Date   CHOLECYSTECTOMY     INNER EAR SURGERY     numerous-pe tubes 7-8 x   NASAL SINUS SURGERY     TONSILLECTOMY AND ADENOIDECTOMY     Allergies  Allergen Reactions   Amoxicillin Other (See Comments)    Yeast infection   Codeine Nausea And Vomiting   Sulfa Antibiotics Other (See Comments)    "Years ago it made me feel terrible"     Physical Exam: General: The patient is alert and oriented x3 in no acute distress.  Dermatology:  No ecchymosis, erythema, or edema bilateral.  No open lesions.    Vascular: Palpable pedal pulses bilaterally. Capillary refill within normal limits.  No appreciable edema.    Neurological: Epicritic sensation is intact  Musculoskeletal Exam:  There is pain on palpation of the plantarmedial & plantarcentral aspect of left heel.  No gaps or nodules within the plantar fascia.  Positive Windlass mechanism bilateral.  Antalgic gait noted with first few steps upon standing.  No pain on palpation of achilles tendon bilateral.  Ankle df less than 10 degrees with knee extended b/l.  Radiographic Exam (left foot 3 weightbearing views, 11/03/2023):  Normal osseous mineralization.  Prolonged splayfoot of the forefoot noted.  Inferior and  posterior calcaneal spurs noted.  There is a linear calcification inferior to the calcaneus in the area of the plantar fascia origin noted on x-ray.  Medial angulation of the 4th and 5th toes PIP joints.  No fracture seen  Assessment/Plan of Care: 1. Pain of left heel   2. Plantar fasciitis, left     -Reviewed etiology of plantar fasciitis with patient.  Discussed treatment options with patient today, including cortisone injection, NSAID course of treatment, stretching exercises, physical therapy, use of night splint, rest, icing the heel, arch supports/orthotics, and supportive shoe gear.    With the patient's verbal consent, a corticosteroid injection was administered to the left heel, consisting of a mixture of 1% lidocaine plain, 0.5% Sensorcaine plain, and Kenalog-10 for a total of 1.5cc administered.  A Band-aid was applied. Pain level post-injection is 5/10.  She will continue with the meloxicam 15 mg 1 pill p.o. daily.  She can call our office if she needs any refills.  Stretching exercises printed and dispensed at checkout  Powerstep inserts fitted and dispensed today.  Follow-up in 4 to 5 weeks.  If the patient is having continued significant pain to the area, we will set up an MRI since she does have the irregular calcification on the plantar aspect of the heel to evaluate this further.  Joe Murders, DPM, FACFAS [facility]     [address] [place], [place] [address]                Office [phone]  Fax [phone]

## 2023-12-09 ENCOUNTER — Ambulatory Visit: Admitting: Podiatry

## 2023-12-09 ENCOUNTER — Encounter: Payer: Self-pay | Admitting: Podiatry

## 2023-12-09 DIAGNOSIS — L600 Ingrowing nail: Secondary | ICD-10-CM | POA: Diagnosis not present

## 2023-12-09 DIAGNOSIS — M722 Plantar fascial fibromatosis: Secondary | ICD-10-CM

## 2023-12-09 NOTE — Progress Notes (Unsigned)
     Chief Complaint  Patient presents with   Follow-up    Patient states that her left foot is 97% better no pain, patient would like for doctor to look at her left Hallux lateral side. Patient states thst she has some pain on left toe nail Hallux    HPI: 63 y.o. female presents today for follow-up of left plantar fasciitis.  She feels that she is significantly better.  She has a secondary concern of her left great toenail along the medial border feeling ingrown.  Denies any drainage.  States there is tenderness with shoes or with pressure.  Past Medical History:  Diagnosis Date   Allergy    seasonal   Diabetes mellitus without complication (HCC)    History of kidney stones    Hypertension    Past Surgical History:  Procedure Laterality Date   CHOLECYSTECTOMY     INNER EAR SURGERY     numerous-pe tubes 7-8 x   NASAL SINUS SURGERY     TONSILLECTOMY AND ADENOIDECTOMY     Allergies  Allergen Reactions   Amoxicillin Other (See Comments)    Yeast infection   Codeine Nausea And Vomiting   Sulfa Antibiotics Other (See Comments)    Years ago it made me feel terrible     Physical Exam: Palpable pedal pulses noted.  No open lesions are noted.  There is minimal to no pain on palpation to the plantar medial and plantar central aspect of the left heel.  There is mild incurvation of the left hallux along the medial nail margin.  No erythema or edema or active drainage is noted.  Epicritic sensation is intact  Assessment/Plan of Care: 1. Plantar fasciitis, left   2. Ingrown toenail     Patient to continue with stretching exercises for the plantar fasciitis as well as good arch supports.  The left hallux nail, medial border, was cut back 50% to the eponychium.  Did discuss a PNA procedure if she has chronic ingrowing toenails to this area.  Antibiotic ointment was applied to the margin.  Follow-up as needed   Awanda CHARM Imperial, DPM, FACFAS [facility]     [address] [place], [place] [address]                Office [phone]  Fax [phone]
